# Patient Record
Sex: Female | Born: 1995 | Race: White | Marital: Single | Smoking: Never smoker
Health system: Northeastern US, Academic
[De-identification: ages and names within clinical notes are randomized; demographics above are authoritative.]

---
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
## 2014-03-25 ENCOUNTER — Ambulatory Visit: Payer: Self-pay | Admitting: Orthopedic Surgery

## 2014-03-27 ENCOUNTER — Encounter: Payer: Self-pay | Admitting: Orthopedic Surgery

## 2014-03-27 ENCOUNTER — Ambulatory Visit: Payer: Self-pay | Admitting: Orthopedic Surgery

## 2014-03-27 VITALS — BP 114/71 | HR 75 | Ht 66.0 in | Wt 140.0 lb

## 2014-03-27 DIAGNOSIS — R2240 Localized swelling, mass and lump, unspecified lower limb: Secondary | ICD-10-CM

## 2014-03-27 NOTE — Progress Notes (Signed)
CHIEF COMPLAINT:  Right posterior lateral thigh mass.    HISTORY OF PRESENT ILLNESS:  The patient is a very pleasant 18 year old female who returns for follow up regarding an abnormality in the right lateral thigh region. The patient strained her right posterior thigh when playing basketball 09/2012, without direct trauma or injury. She noticed a lump that was not painful or tender. It was evaluated 05/2013, and MRI demonstrated findings consistent with resolving hematoma in the region. She returns today for repeat evaluation. She notes the lump is still there and has not gotten any bigger or smaller. It is nontender and nonpainful. She denies constitutional symptoms. She denies numbness/tingling. She continues to be fully active.      PHYSICAL EXAM:  On exam she is a well appearing female who looks appropriate for stated age of 18. Pupils are equal symmetric facial movements are noted. She is examined on the examining table. She is able to stand without any difficulties and has a normal gait. Shows normal range of motion of shoulder wrists and elbows without any major disturbances or problems. Examination of the lower extremities reveals that she has normal motor normal sensory. No abnormalities of the skin there detect on exam today.    Focused exam of the right lateral posterior hip region reveals a subtle possible hardness in the right lateral thigh, unchanged from previous examination. The skin is firm and rather indurated, but there is no warmth, tenderness, or erythema. There is no fluctuance. There is no discrete mass or deformity. No skin discoloration noted in the area. Distally NVI.     IMAGING:  Imaging reviewed from last year. The MRI again demonstrated a structure within the subcutaneous fat that has the appearance of a resolving hematoma.     ASSESSMENT AND PLAN:  18 year old female s/p injury to R posterolateral thigh 1 year ago with persistent firmness in that region. We would have expected this  firmness to have resolved by this time and given its persistence, we have ordered a repeat MRI with and without contrast to assess whether anything has changed on the imaging. If it remains stable, we will continue to monitor it. If there is anything concerning, we will have her follow up after the MRI to discuss further plans.     Governor SpeckingWenjing Zeng, MD  Orthopaedic Surgery, PGY-3  11:51 AM 03/27/2014     Patient seen and examined today. The above reviewed and confirmed. At this point a bit concerned she hasn't had resolution of this abnormality in the right lateral thigh and is resolved I think it is reasonable to repeat an MRI with contrast to evaluate the area to make sure is not something of concern that we need to be worried about this time. Sometimes things like desmoid tumor September set in this manner a slow growing masses. I will plan to call her after get the results of the MRI and we'll make a plan of action based on those results after that.

## 2014-03-29 ENCOUNTER — Ambulatory Visit: Payer: Self-pay | Admitting: Orthopedic Surgery

## 2014-04-03 ENCOUNTER — Ambulatory Visit: Payer: Self-pay | Admitting: Orthopedic Surgery

## 2014-04-10 ENCOUNTER — Ambulatory Visit: Payer: Self-pay | Admitting: Orthopedic Surgery

## 2017-10-08 ENCOUNTER — Encounter (HOSPITAL_COMMUNITY): Payer: Self-pay | Admitting: Nurse Practitioner

## 2017-10-08 ENCOUNTER — Emergency Department (HOSPITAL_COMMUNITY): Payer: BLUE CROSS/BLUE SHIELD

## 2017-10-08 ENCOUNTER — Other Ambulatory Visit: Payer: Self-pay

## 2017-10-08 ENCOUNTER — Emergency Department (HOSPITAL_COMMUNITY)
Admission: EM | Admit: 2017-10-08 | Discharge: 2017-10-08 | Disposition: A | Discharge status: Home / Self Care | Payer: BLUE CROSS/BLUE SHIELD | Attending: Emergency Medicine | Admitting: Emergency Medicine

## 2017-10-08 DIAGNOSIS — Z23 Encounter for immunization: Secondary | ICD-10-CM | POA: Diagnosis not present

## 2017-10-08 DIAGNOSIS — S6991XA Unspecified injury of right wrist, hand and finger(s), initial encounter: Secondary | ICD-10-CM | POA: Diagnosis present

## 2017-10-08 DIAGNOSIS — Z2914 Encounter for prophylactic rabies immune globin: Secondary | ICD-10-CM | POA: Insufficient documentation

## 2017-10-08 DIAGNOSIS — Y999 Unspecified external cause status: Secondary | ICD-10-CM | POA: Diagnosis not present

## 2017-10-08 DIAGNOSIS — Y939 Activity, unspecified: Secondary | ICD-10-CM | POA: Diagnosis not present

## 2017-10-08 DIAGNOSIS — Y929 Unspecified place or not applicable: Secondary | ICD-10-CM | POA: Insufficient documentation

## 2017-10-08 DIAGNOSIS — S61237A Puncture wound without foreign body of left little finger without damage to nail, initial encounter: Secondary | ICD-10-CM | POA: Insufficient documentation

## 2017-10-08 DIAGNOSIS — W540XXA Bitten by dog, initial encounter: Secondary | ICD-10-CM | POA: Insufficient documentation

## 2017-10-08 MED ORDER — TETANUS-DIPHTH-ACELL PERTUSSIS 5-2.5-18.5 LF-MCG/0.5 IM SUSP
0.5000 mL | Freq: Once | INTRAMUSCULAR | Status: AC
  Administered 2017-10-08: 0.5 mL via INTRAMUSCULAR
  Filled 2017-10-08: qty 0.5

## 2017-10-08 MED ORDER — AMOXICILLIN-POT CLAVULANATE 875-125 MG PO TABS
1.0000 | ORAL_TABLET | Freq: Once | ORAL | Status: AC
  Administered 2017-10-08: 1 via ORAL
  Filled 2017-10-08: qty 1

## 2017-10-08 MED ORDER — RABIES VACCINE, PCEC IM SUSR
1.0000 mL | Freq: Once | INTRAMUSCULAR | Status: AC
  Administered 2017-10-08: 1 mL via INTRAMUSCULAR
  Filled 2017-10-08: qty 1

## 2017-10-08 MED ORDER — RABIES IMMUNE GLOBULIN 150 UNIT/ML IM INJ
20.0000 [IU]/kg | INJECTION | Freq: Once | INTRAMUSCULAR | Status: AC
  Administered 2017-10-08: 1200 [IU] via INTRAMUSCULAR
  Filled 2017-10-08: qty 8

## 2017-10-08 MED ORDER — AMOXICILLIN-POT CLAVULANATE 875-125 MG PO TABS: 1.0000 | ORAL_TABLET | Freq: Two times a day (BID) | ORAL | 0 refills | Status: AC

## 2017-10-08 NOTE — ED Provider Notes (Signed)
[facility]-EMERGENCY DEPT Provider Note   CSN: [id number] Arrival date & time: 10/08/17  [id number]     History   Chief Complaint Chief Complaint  Patient presents with  . Animal Bite    HPI Angela Cameron is a 21 y.o. female who presents to the ED with a dog bite to the right little finger. The bite happened yesterday. The patient reports she was walking her dog in a dog park and her dog and another dog started to fight and she tried to break it up and got bit on her little finger. She is not sure which dog bit her. She is not sure if the other dog is up to date on rabies. She does not have a way to contact the owner. Patient is not up to date on tetanus. Patient request rabies vaccine.   HPI  History reviewed. No pertinent past medical history.  There are no active problems to display for this patient.   History reviewed. No pertinent surgical history.  OB History    No data available       Home Medications    Prior to Admission medications   Medication Sig Start Date End Date Taking? Authorizing Provider  amoxicillin-clavulanate (AUGMENTIN) 875-125 MG tablet Take 1 tablet by mouth every 12 (twelve) hours. 10/08/17   Janne NapoleonNeese, Ronia Hazelett M, NP    Family History History reviewed. No pertinent family history.  Social History Social History   Tobacco Use  . Smoking status: Never Smoker  . Smokeless tobacco: Never Used  Substance Use Topics  . Alcohol use: Yes    Frequency: Never    Comment: socially   . Drug use: No     Allergies   Patient has no known allergies.   Review of Systems Review of Systems  Musculoskeletal: Positive for arthralgias.  Skin: Positive for wound.  All other systems reviewed and are negative.    Physical Exam Updated Vital Signs BP 112/68 (BP Location: Left Arm)   Pulse 64   Temp 98 F (36.7 C)   Resp 18   Ht 5\' 5"  (1.651 m)   Wt 61.2 kg (135 lb)   LMP 09/25/2017 (Exact Date)   SpO2 99%   BMI 22.47 kg/m    Physical Exam  Constitutional: She is oriented to person, place, and time. She appears well-developed and well-nourished. No distress.  HENT:  Head: Normocephalic.  Eyes: EOM are normal.  Neck: Neck supple.  Cardiovascular: Normal rate.  Pulmonary/Chest: Effort normal.  Musculoskeletal: Normal range of motion.  There are 2 puncture wound to the dorsum of the right little finger at the DIP and one puncture to the palmar aspect at the DIP. There is minimal swelling and no erythema. Patient has full flexion and extension of the finger.   Neurological: She is alert and oriented to person, place, and time. No cranial nerve deficit.  Skin: Skin is warm and dry.  Psychiatric: She has a normal mood and affect. Her behavior is normal.  Nursing note and vitals reviewed.    ED Treatments / Results  Labs (all labs ordered are listed, but only abnormal results are displayed) Labs Reviewed - No data to display  Radiology Dg Finger Little Right  Result Date: 10/08/2017 CLINICAL DATA:  Dog bite EXAM: RIGHT LITTLE FINGER 2+V COMPARISON:  None. FINDINGS: There is no evidence of fracture or dislocation. There is no evidence of arthropathy or other focal bone abnormality. Soft tissues are unremarkable. No soft  tissue gas or radiopaque foreign body in the soft tissues. IMPRESSION: No acute bony abnormality. Electronically Signed   By: Charlett NoseKevin  Dover M.D.   On: 10/08/2017 12:49    Procedures Procedures (including critical care time)  Medications Ordered in ED Medications  amoxicillin-clavulanate (AUGMENTIN) 875-125 MG per tablet 1 tablet (1 tablet Oral Given 10/08/17 1246)  rabies vaccine (RABAVERT) injection 1 mL (1 mL Intramuscular Given 10/08/17 1251)  rabies immune globulin (HYPERAB/KEDRAB) injection 1,200 Units (1,200 Units Intramuscular Given 10/08/17 1252)  Tdap (BOOSTRIX) injection 0.5 mL (0.5 mLs Intramuscular Given 10/08/17 1247)     Initial Impression / Assessment and Plan / ED Course  I  have reviewed the triage vital signs and the nursing notes. 21 y.o. female with dog bite to the right little finger stable for d/c after receiving the rabies vaccine and started on antibiotics.tetanus updated. Wounds cleaned. Return instructions discussed. Patient to f/u in 2 days for wound check and continued rabies vaccine.   Final Clinical Impressions(s) / ED Diagnoses   Final diagnoses:  Dog bite, initial encounter    ED Discharge Orders        Ordered    amoxicillin-clavulanate (AUGMENTIN) 875-125 MG tablet  Every 12 hours     10/08/17 1255       Damian Leavelleese, [place] M, [place]NP 10/09/17 1930    Alvira MondaySchlossman, Erin, MD 10/13/17 1241

## 2017-10-08 NOTE — ED Triage Notes (Signed)
Pt states she was bit by either her dog or the neighbors dog yesterday on right pinky finger. She knows that her dog is vaccinated and the neighbor reports that their dog is but was unable to produce vaccination records. Pt reports calling urgent care who advised her to be seen in ER. Patient has 2 small teeth punctures to right pinky finger.

## 2017-10-08 NOTE — ED Notes (Signed)
Bed: WTR7 Expected date:  Expected time:  Means of arrival:  Comments: 

## 2017-10-08 NOTE — Discharge Instructions (Signed)
°                                  RABIES VACCINE FOLLOW UP  Patient's Name: Angela AbtsSarah Cameron                     Original Order Date:10/08/2017  Medical Record Number: [id number]  ED Physician: Alvira MondaySchlossman, Erin, MD Primary Diagnosis: Rabies Exposure       PCP: Patient, No Pcp Per  Patient Phone Number: (home) [phone] (home)    (cell)  No relevant phone numbers on file.   (work) There is no work phone number on file. Species of Animal:     You have been seen in the Emergency Department for a possible rabies exposure. It's very important you return for the additional vaccine doses.  Please call the clinic listed below for hours of operation.   Clinic that will administer your rabies vaccines:    DAY 0:  10/08/2017      DAY 3:  10/11/2017       DAY 7:  10/15/2017     DAY 14:  10/22/2017         The 5th vaccine injection is considered for immune compromised patients only.  DAY 28:  11/05/2017

## 2017-10-11 ENCOUNTER — Ambulatory Visit (HOSPITAL_COMMUNITY)
Admission: EM | Admit: 2017-10-11 | Discharge: 2017-10-11 | Disposition: A | Discharge status: Home / Self Care | Payer: BLUE CROSS/BLUE SHIELD | Attending: Internal Medicine | Admitting: Internal Medicine

## 2017-10-11 DIAGNOSIS — Z203 Contact with and (suspected) exposure to rabies: Secondary | ICD-10-CM

## 2017-10-11 MED ORDER — RABIES VACCINE, PCEC IM SUSR
1.0000 mL | Freq: Once | INTRAMUSCULAR | Status: AC
  Administered 2017-10-11: 1 mL via INTRAMUSCULAR

## 2017-10-11 MED ORDER — RABIES VACCINE, PCEC IM SUSR
INTRAMUSCULAR | Status: AC
  Filled 2017-10-11: qty 1

## 2017-10-11 NOTE — ED Triage Notes (Signed)
Pt here for day 3 rabies shot.  

## 2017-10-15 ENCOUNTER — Ambulatory Visit (HOSPITAL_COMMUNITY)
Admission: EM | Admit: 2017-10-15 | Discharge: 2017-10-15 | Disposition: A | Discharge status: Home / Self Care | Payer: BLUE CROSS/BLUE SHIELD | Attending: Physician Assistant | Admitting: Physician Assistant

## 2017-10-15 DIAGNOSIS — Z23 Encounter for immunization: Secondary | ICD-10-CM | POA: Diagnosis not present

## 2017-10-15 DIAGNOSIS — Z203 Contact with and (suspected) exposure to rabies: Secondary | ICD-10-CM

## 2017-10-15 MED ORDER — RABIES VACCINE, PCEC IM SUSR
1.0000 mL | Freq: Once | INTRAMUSCULAR | Status: AC
  Administered 2017-10-15: 1 mL via INTRAMUSCULAR

## 2017-10-15 MED ORDER — RABIES VACCINE, PCEC IM SUSR
INTRAMUSCULAR | Status: AC
  Filled 2017-10-15: qty 1

## 2017-10-15 NOTE — ED Notes (Signed)
Patient states that she will be going out of town and will need to come 1 day early for next injection. Discussed with providers here today and they verbalized that was ok. Patient agrees with tx plan.

## 2017-10-15 NOTE — ED Triage Notes (Signed)
Patient presents for day 7 rabies injection. No problems with last injection. She tolerated well.

## 2017-10-21 ENCOUNTER — Encounter (HOSPITAL_COMMUNITY): Payer: Self-pay | Admitting: Emergency Medicine

## 2017-10-21 ENCOUNTER — Ambulatory Visit (HOSPITAL_COMMUNITY)
Admission: EM | Admit: 2017-10-21 | Discharge: 2017-10-21 | Disposition: A | Discharge status: Home / Self Care | Payer: BLUE CROSS/BLUE SHIELD | Attending: Internal Medicine | Admitting: Internal Medicine

## 2017-10-21 DIAGNOSIS — Z203 Contact with and (suspected) exposure to rabies: Secondary | ICD-10-CM

## 2017-10-21 DIAGNOSIS — Z23 Encounter for immunization: Secondary | ICD-10-CM | POA: Diagnosis not present

## 2017-10-21 MED ORDER — RABIES VACCINE, PCEC IM SUSR
INTRAMUSCULAR | Status: AC
  Filled 2017-10-21: qty 1

## 2017-10-21 MED ORDER — RABIES VACCINE, PCEC IM SUSR
1.0000 mL | Freq: Once | INTRAMUSCULAR | Status: AC
  Administered 2017-10-21: 1 mL via INTRAMUSCULAR

## 2017-10-21 NOTE — ED Triage Notes (Signed)
Pt here for day 14 rabies  Voices no new concerns.

## 2019-01-03 IMAGING — DX DG FINGER LITTLE 2+V*R*
3 series · 3 of 3 positions shown · non-contrast
Comparison: None.

CLINICAL DATA: Dog bite

EXAM:
RIGHT LITTLE FINGER 2+V

[finger ap]
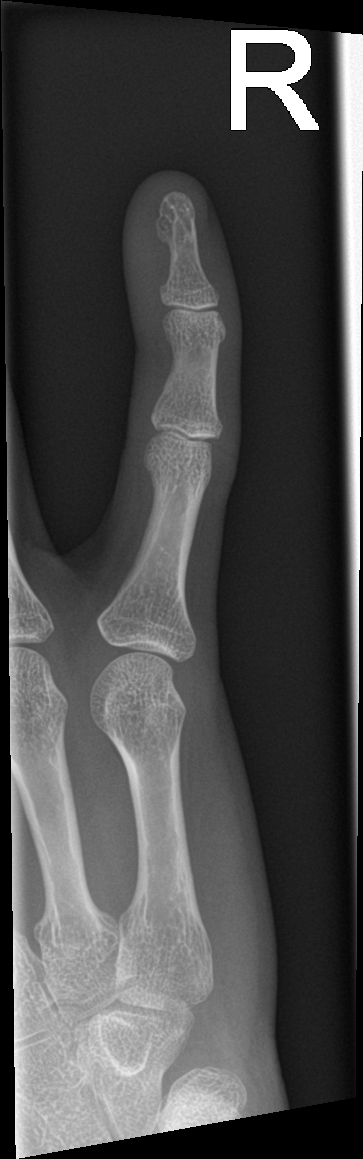

[finger obl]
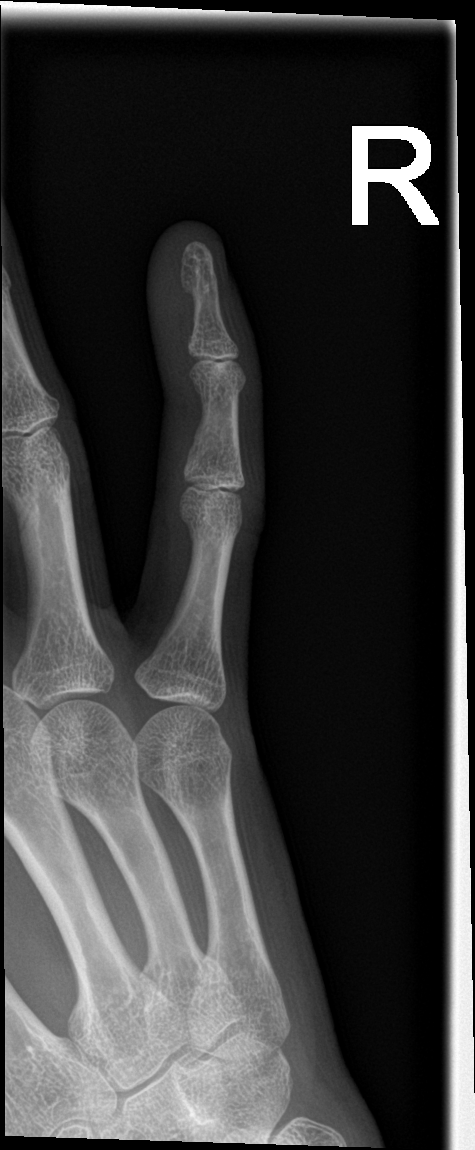

[finger lat]
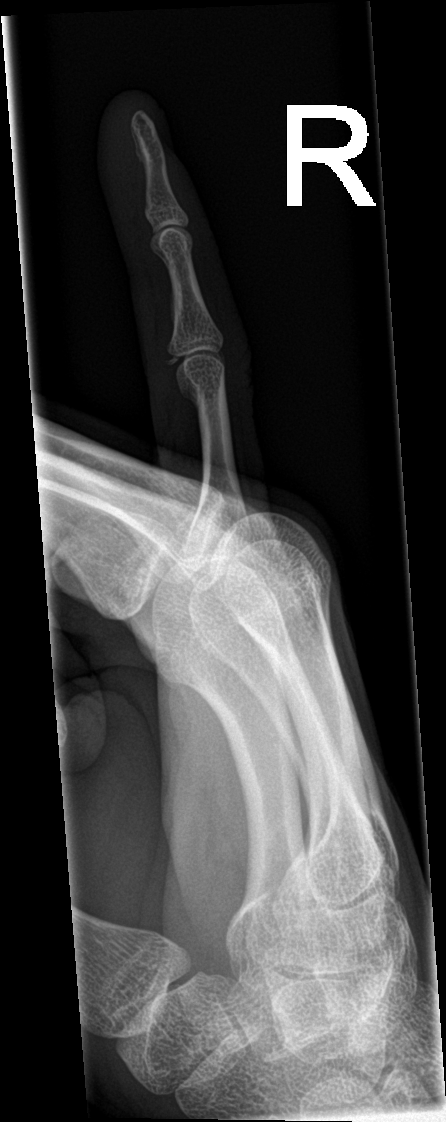

[3 of 3 positions shown; findings below may reference images not displayed]

FINDINGS: There is no evidence of fracture or dislocation. There is no
evidence of arthropathy or other focal bone abnormality. Soft
tissues are unremarkable. No soft tissue gas or radiopaque foreign
body in the soft tissues.
IMPRESSION: No acute bony abnormality.

## 4693-03-08 DEATH — deceased
